# Patient Record
Sex: Male | Born: 1990 | Race: Black or African American | Hispanic: No | Marital: Single | Smoking: Current every day smoker
Health system: Southern US, Community
[De-identification: ages and names within clinical notes are randomized; demographics above are authoritative.]

---
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
## 2018-08-24 ENCOUNTER — Other Ambulatory Visit: Payer: Self-pay

## 2018-08-24 ENCOUNTER — Emergency Department (HOSPITAL_BASED_OUTPATIENT_CLINIC_OR_DEPARTMENT_OTHER)
Admission: EM | Admit: 2018-08-24 | Discharge: 2018-08-24 | Disposition: A | Discharge status: Home / Self Care | Payer: Self-pay | Attending: Emergency Medicine | Admitting: Emergency Medicine

## 2018-08-24 ENCOUNTER — Encounter (HOSPITAL_BASED_OUTPATIENT_CLINIC_OR_DEPARTMENT_OTHER): Payer: Self-pay | Admitting: *Deleted

## 2018-08-24 ENCOUNTER — Emergency Department (HOSPITAL_BASED_OUTPATIENT_CLINIC_OR_DEPARTMENT_OTHER): Payer: Self-pay

## 2018-08-24 DIAGNOSIS — F17210 Nicotine dependence, cigarettes, uncomplicated: Secondary | ICD-10-CM | POA: Insufficient documentation

## 2018-08-24 DIAGNOSIS — Z23 Encounter for immunization: Secondary | ICD-10-CM | POA: Insufficient documentation

## 2018-08-24 DIAGNOSIS — Y999 Unspecified external cause status: Secondary | ICD-10-CM | POA: Insufficient documentation

## 2018-08-24 DIAGNOSIS — Y9389 Activity, other specified: Secondary | ICD-10-CM | POA: Insufficient documentation

## 2018-08-24 DIAGNOSIS — W25XXXA Contact with sharp glass, initial encounter: Secondary | ICD-10-CM | POA: Insufficient documentation

## 2018-08-24 DIAGNOSIS — S51811A Laceration without foreign body of right forearm, initial encounter: Secondary | ICD-10-CM | POA: Insufficient documentation

## 2018-08-24 DIAGNOSIS — M25531 Pain in right wrist: Secondary | ICD-10-CM | POA: Insufficient documentation

## 2018-08-24 DIAGNOSIS — Y92008 Other place in unspecified non-institutional (private) residence as the place of occurrence of the external cause: Secondary | ICD-10-CM | POA: Insufficient documentation

## 2018-08-24 MED ORDER — TETANUS-DIPHTH-ACELL PERTUSSIS 5-2.5-18.5 LF-MCG/0.5 IM SUSP
0.5000 mL | Freq: Once | INTRAMUSCULAR | Status: AC
  Administered 2018-08-24: 02:00:00 0.5 mL via INTRAMUSCULAR
  Filled 2018-08-24: qty 0.5

## 2018-08-24 MED ORDER — LIDOCAINE-EPINEPHRINE (PF) 2 %-1:200000 IJ SOLN
INTRAMUSCULAR | Status: AC
  Filled 2018-08-24: qty 10

## 2018-08-24 MED ORDER — KETOROLAC TROMETHAMINE 60 MG/2ML IM SOLN
30.0000 mg | Freq: Once | INTRAMUSCULAR | Status: AC
  Administered 2018-08-24: 30 mg via INTRAMUSCULAR
  Filled 2018-08-24: qty 2

## 2018-08-24 NOTE — Discharge Instructions (Addendum)
Please return to the emergency department or urgent care in 7-14 days for suture removal.   If you notice any signs of infection, such as: fever, redness, drainage, please return to the emergency department for evaluation.   For wrist pain and/or swelling, you can take over the counter Ibuprofen or other pain medication. Make sure to take with food.

## 2018-08-24 NOTE — ED Triage Notes (Signed)
Pt reports that he cut his arm on a window trying to climb through to get into his house.  approx 2 cm lac to right forearm.

## 2018-08-24 NOTE — ED Provider Notes (Signed)
[facility] EMERGENCY DEPARTMENT Provider Note   CSN: [id number] Arrival date & time: 08/24/18  0102    History   Chief Complaint Chief Complaint  Patient presents with  . Extremity Laceration    HPI Stephen Murray is a 28 y.o. male who presents to the ED with c/o right forearm lacerations and right wrist pain.   Stephen Murray states he was trying to get into his home via a sliding glass window, as he lost his keys. When sliding the glass, it fell off the track and broke, cutting his right forearm. Stephen Murray also endorses right wrist pain and swelling. He states he can still move his wrist. He denies punching or hitting anything. Denies numbness in his arms.   The history is provided by the patient.    History reviewed. No pertinent past medical history.  There are no active problems to display for this patient.   History reviewed. No pertinent surgical history.      Home Medications    Prior to Admission medications   Not on File    Family History History reviewed. No pertinent family history.  Social History Social History   Tobacco Use  . Smoking status: Current Every Day Smoker    Packs/day: 0.50    Types: Cigarettes  Substance Use Topics  . Alcohol use: Yes    Comment: socially  . Drug use: Not Currently     Allergies   Patient has no known allergies.   Review of Systems Review of Systems  Musculoskeletal: Positive for arthralgias, joint swelling (right wrist) and myalgias (right forearm and wrist).  Skin: Positive for wound.  Neurological: Negative for dizziness, weakness and numbness.  All other systems reviewed and are negative.    Physical Exam Updated Vital Signs BP 125/72 (BP Location: Right Arm)   Pulse 97   Temp 98.7 F (37.1 C) (Oral)   Resp 18   Ht 5\' 5"  (1.651 m)   Wt 68 kg   SpO2 100%   BMI 24.96 kg/m   Physical Exam Vitals signs and nursing note reviewed.  Constitutional:    General: He is not in acute distress.    Appearance: Normal appearance. He is not toxic-appearing.  HENT:     Head: Normocephalic and atraumatic.  Pulmonary:     Effort: Pulmonary effort is normal.  Musculoskeletal:     Right elbow: He exhibits normal range of motion, no swelling and no deformity. No tenderness found.     Right wrist: He exhibits tenderness, bony tenderness and swelling. He exhibits normal range of motion and no deformity.       Arms:  Skin:    General: Skin is warm and dry.  Neurological:     General: No focal deficit present.     Mental Status: He is alert and oriented to person, place, and time. Mental status is at baseline.  Psychiatric:        Attention and Perception: Attention normal.        Mood and Affect: Mood normal.        Behavior: Behavior normal.        Thought Content: Thought content normal.        Judgment: Judgment normal.      ED Treatments / Results  Labs (all labs ordered are listed, but only abnormal results are displayed) Labs Reviewed - No data to display  EKG None  Radiology Dg Forearm Right  Result Date: 08/24/2018 CLINICAL DATA:  Cut anterior  forearm climbing through window, puncture wound EXAM: RIGHT FOREARM - 2 VIEW COMPARISON:  None. FINDINGS: There is no evidence of fracture or other focal bone lesions. Wound along the volar surface of the distal forearm. No radiopaque foreign body. IMPRESSION: No acute osseous abnormality Electronically Signed   By: Donavan Foil M.D.   On: 08/24/2018 02:10    Procedures .Marland KitchenLaceration Repair  Date/Time: 08/24/2018 1:57 AM Performed by: Jose Persia, MD Authorized by: Merrily Pew, MD   Consent:    Consent obtained:  Verbal   Consent given by:  Patient Anesthesia (see MAR for exact dosages):    Anesthesia method:  Local infiltration   Local anesthetic:  Lidocaine 2% WITH epi Laceration details:    Location:  Shoulder/arm   Shoulder/arm location:  R lower arm   Length (cm):  2    Depth (mm):  2 Repair type:    Repair type:  Simple Pre-procedure details:    Preparation:  Patient was prepped and draped in usual sterile fashion and imaging obtained to evaluate for foreign bodies Exploration:    Hemostasis achieved with:  Direct pressure   Wound exploration: entire depth of wound probed and visualized     Wound extent: areolar tissue violated     Contaminated: no   Treatment:    Area cleansed with:  Saline   Amount of cleaning:  Standard   Irrigation solution:  Sterile saline   Irrigation method:  Pressure wash   Visualized foreign bodies/material removed: no   Skin repair:    Repair method:  Sutures   Suture size:  4-0   Suture material:  Prolene   Suture technique:  Simple interrupted   Number of sutures:  4 Approximation:    Approximation:  Close Post-procedure details:    Dressing:  Antibiotic ointment and non-adherent dressing   Patient tolerance of procedure:  Tolerated well, no immediate complications  .Marland KitchenLaceration Repair  Date/Time: 08/24/2018 4:47 AM Performed by: Jose Persia, MD Authorized by: Merrily Pew, MD   Consent:    Consent obtained:  Verbal   Consent given by:  Patient Anesthesia (see MAR for exact dosages):    Anesthesia method:  Local infiltration   Local anesthetic:  Lidocaine 2% WITH epi Laceration details:    Location:  Shoulder/arm   Shoulder/arm location:  R lower arm   Length (cm):  1.5   Depth (mm):  2 Repair type:    Repair type:  Simple Pre-procedure details:    Preparation:  Patient was prepped and draped in usual sterile fashion and imaging obtained to evaluate for foreign bodies Exploration:    Hemostasis achieved with:  Direct pressure   Wound exploration: entire depth of wound probed and visualized     Wound extent: areolar tissue violated     Contaminated: no   Treatment:    Area cleansed with:  Saline   Amount of cleaning:  Standard   Irrigation solution:  Sterile saline   Irrigation method:   Pressure wash   Visualized foreign bodies/material removed: no   Skin repair:    Repair method:  Sutures   Suture size:  4-0   Suture material:  Prolene   Suture technique:  Simple interrupted   Number of sutures:  2 Approximation:    Approximation:  Close Post-procedure details:    Dressing:  Antibiotic ointment and non-adherent dressing   Patient tolerance of procedure:  Tolerated well, no immediate complications   (including critical care time)  Medications Ordered in ED Medications  lidocaine-EPINEPHrine (XYLOCAINE W/EPI) 2 %-1:200000 (PF) injection (has no administration in time range)  Tdap (BOOSTRIX) injection 0.5 mL (0.5 mLs Intramuscular Given 08/24/18 0213)  ketorolac (TORADOL) injection 30 mg (30 mg Intramuscular Given 08/24/18 [id number])     Initial Impression / Assessment and Plan / ED Course  I have reviewed the triage vital signs and the nursing notes.  Pertinent labs & imaging results that were available during my care of the patient were reviewed by me and considered in my medical decision making (see chart for details).  Stephen Murray is a 28 y/o male presenting to the ED for evaluation of his right forearm laceration and right wrist pain. Patient claims he was trying to get into his home through a sliding glass window, when it shattered. He has two lacerations on his right forearm that both require sutures. Xray of forearm was negative for foreign bodies. The anterior laceration required 4 sutures while the posterior laceration required only 2. There was some fatty tissue exposed that was able to be pushed back in. Patient tolerated the procedure well and without complications.   He is also complaining of right wrist pain and swelling, although he denies hitting anything. Xray was obtained and was negative for fracture. Discussed Ibuprofen or other OTC pain medication for swelling and pain control. Patient stated he had Ibuprofen at home and was familiar with how to  take it.   Prior to discharge, discussed with patient to keep lacerations clean and to wash occasionally with just soap and water. Discussed signs of infection and that he should return if any occur. Discussed suture removal at any ED or urgent care in 10-14 days. Stephen Murray expressed understanding of the plan.     Final Clinical Impressions(s) / ED Diagnoses   Final diagnoses:  Laceration of right forearm, initial encounter  Acute pain of right wrist    ED Discharge Orders    None       Verdene LennertBasaraba, Mindie Rawdon, MD 08/24/18 [id number]    Marily MemosMesner, Jason, MD 08/25/18 [phone]

## 2020-07-18 IMAGING — CR RIGHT FOREARM - 2 VIEW
2 series · 2 of 2 positions shown · non-contrast
Comparison: None.

CLINICAL DATA: Cut anterior forearm climbing through window,
puncture wound

EXAM:
RIGHT FOREARM - 2 VIEW

[x forearm ap right]
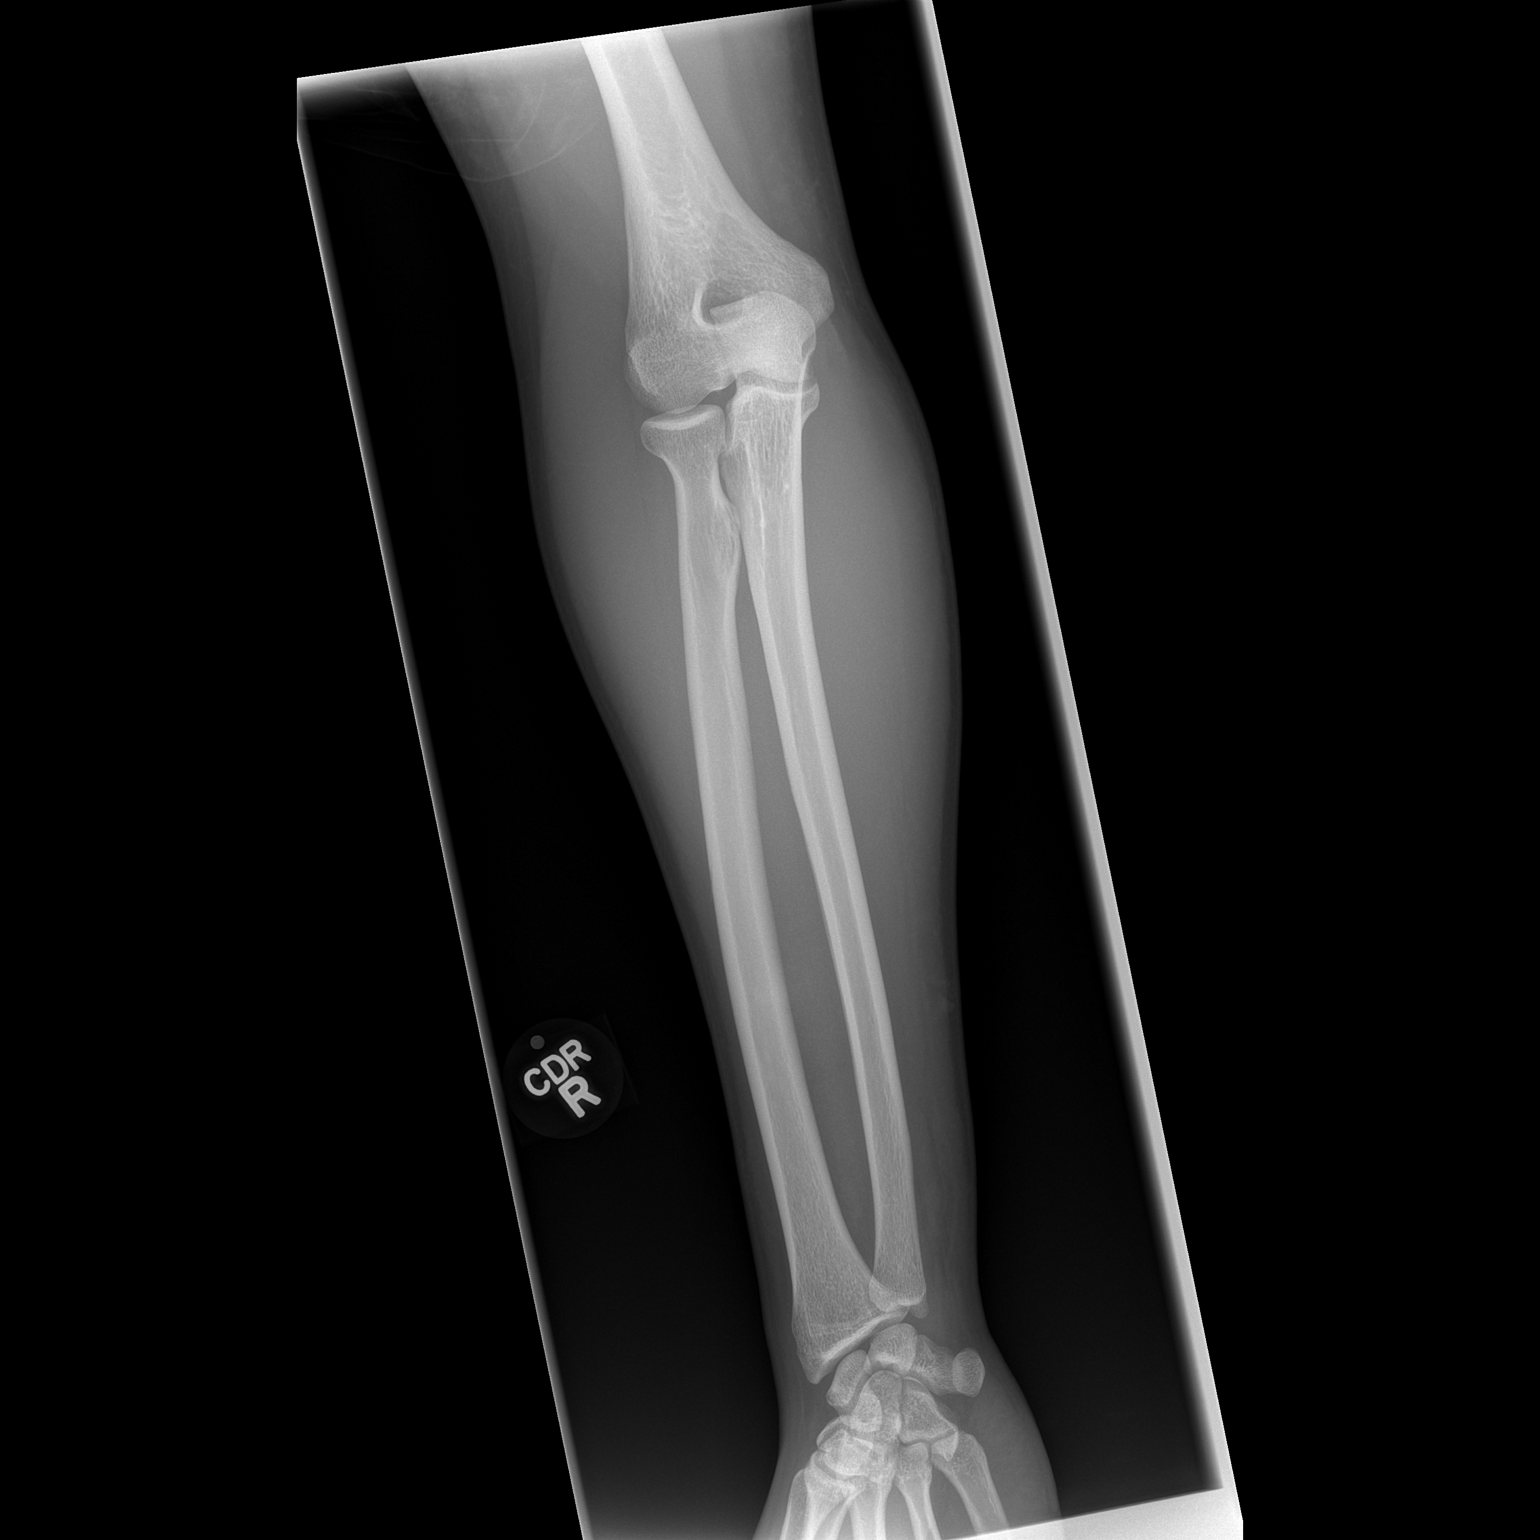

[x forearm lat right]
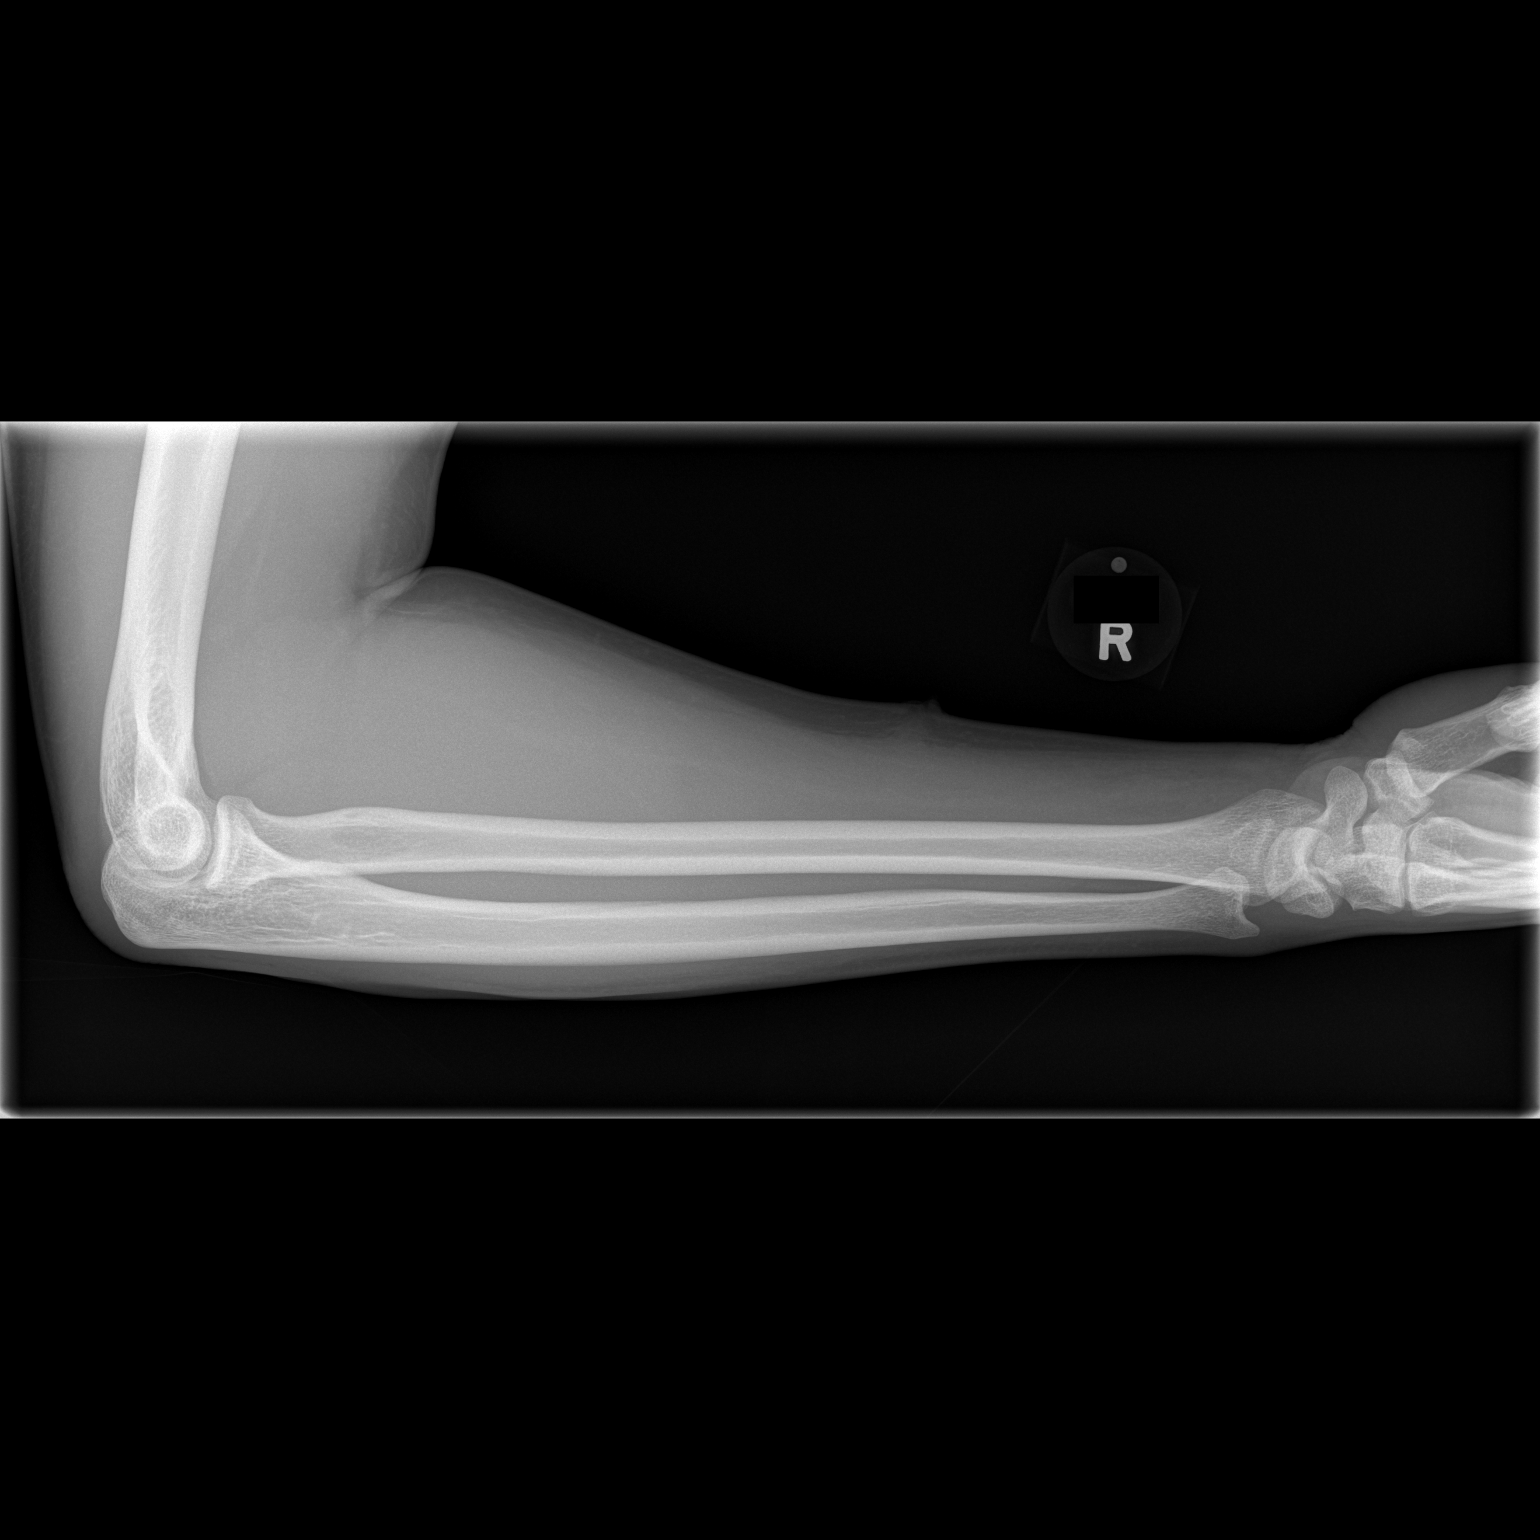

[2 of 2 positions shown; findings below may reference images not displayed]

FINDINGS: There is no evidence of fracture or other focal bone lesions. Wound
along the volar surface of the distal forearm. No radiopaque foreign
body.
IMPRESSION: No acute osseous abnormality
